# Patient Record
Sex: Male | Born: 2005 | Race: White | Hispanic: No | Marital: Single | Smoking: Never smoker
Health system: Southern US, Community
[De-identification: ages and names within clinical notes are randomized; demographics above are authoritative.]

## PROBLEM LIST (undated history)

## (undated) DIAGNOSIS — J45909 Unspecified asthma, uncomplicated: Secondary | ICD-10-CM

## (undated) DIAGNOSIS — F909 Attention-deficit hyperactivity disorder, unspecified type: Secondary | ICD-10-CM

## (undated) HISTORY — DX: Attention-deficit hyperactivity disorder, unspecified type: F90.9

---
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
## 2024-07-12 ENCOUNTER — Other Ambulatory Visit: Payer: Self-pay

## 2024-07-12 ENCOUNTER — Ambulatory Visit: Payer: Self-pay | Admitting: Medical

## 2024-07-12 ENCOUNTER — Encounter: Payer: Self-pay | Admitting: Medical

## 2024-07-12 VITALS — BP 130/80 | HR 82 | Temp 98.3°F | Ht 71.0 in | Wt 210.0 lb

## 2024-07-12 DIAGNOSIS — M79671 Pain in right foot: Secondary | ICD-10-CM
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
## 2024-07-17 ENCOUNTER — Encounter: Payer: Self-pay | Admitting: Medical

## 2024-07-17 NOTE — Patient Instructions (Signed)
 Please go to [facility] for an xray and further evaluation.
